# Patient Record
Sex: Female | Born: 2005 | Hispanic: Yes | Marital: Single | Smoking: Never smoker
Health system: Southern US, Community
[De-identification: ages and names within clinical notes are randomized; demographics above are authoritative.]

---
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
## 2023-02-10 ENCOUNTER — Telehealth: Payer: Self-pay

## 2023-02-10 ENCOUNTER — Other Ambulatory Visit: Payer: Self-pay | Admitting: Obstetrics and Gynecology

## 2023-02-10 DIAGNOSIS — E0590 Thyrotoxicosis, unspecified without thyrotoxic crisis or storm: Secondary | ICD-10-CM

## 2023-03-10 ENCOUNTER — Other Ambulatory Visit: Payer: Self-pay

## 2023-03-10 ENCOUNTER — Ambulatory Visit: Payer: Medicaid Other | Attending: Maternal & Fetal Medicine

## 2023-03-10 ENCOUNTER — Ambulatory Visit: Payer: Medicaid Other | Admitting: Maternal & Fetal Medicine

## 2023-03-10 DIAGNOSIS — Z3A27 27 weeks gestation of pregnancy: Secondary | ICD-10-CM | POA: Insufficient documentation

## 2023-03-10 DIAGNOSIS — O99280 Endocrine, nutritional and metabolic diseases complicating pregnancy, unspecified trimester: Secondary | ICD-10-CM | POA: Diagnosis present

## 2023-03-10 DIAGNOSIS — Z363 Encounter for antenatal screening for malformations: Secondary | ICD-10-CM | POA: Diagnosis not present

## 2023-03-10 DIAGNOSIS — O99283 Endocrine, nutritional and metabolic diseases complicating pregnancy, third trimester: Secondary | ICD-10-CM | POA: Diagnosis not present

## 2023-03-10 DIAGNOSIS — E0590 Thyrotoxicosis, unspecified without thyrotoxic crisis or storm: Secondary | ICD-10-CM | POA: Diagnosis present

## 2023-03-10 DIAGNOSIS — O35BXX0 Maternal care for other (suspected) fetal abnormality and damage, fetal cardiac anomalies, not applicable or unspecified: Secondary | ICD-10-CM

## 2023-03-10 DIAGNOSIS — O99282 Endocrine, nutritional and metabolic diseases complicating pregnancy, second trimester: Secondary | ICD-10-CM | POA: Insufficient documentation

## 2023-03-10 DIAGNOSIS — O358XX0 Maternal care for other (suspected) fetal abnormality and damage, not applicable or unspecified: Secondary | ICD-10-CM | POA: Insufficient documentation

## 2023-03-10 NOTE — Progress Notes (Signed)
MFM Consultation  Donna Barajas is a 17 yo G1P0 who is seen at 69 w 4 d with and EDD of 06/05/23. She is seen at the request of Dr. Thomasene Mohair regarding the diagnosis of hyperthyroidism.  Today she is doing well without s/sx of PTL or preeclampsia. In addition, she denies s/sx of hyperthyroidism such as tachycardia, weight loss, or hair loss. She reports never experiencing these symptoms.  Per her records she had an early  (11/12/22) low TSH of  0.17 and FT4 of 1.40 both slightly off of cut off.  On 5/23 she had FT4 0.62  and a TSH of .706 On 6/24 she had a TSH of 1.295 and FT4 0.65.   She was started on Methimazole 10 mg on 3/29. Donna Barajas reports taking it more consistently now but was without it for a week. She reports that she feels no different at this time.   Thus her was in her first trimester when the medication was began.  Lastly, Donna Barajas reports that she will be leaving for [place] on 8/25 where she plans to deliver the baby as the father of the baby lives there.      03/10/2023   12:56 PM 12/08/2022    8:04 PM 12/08/2022    5:13 PM  Vitals with BMI  Height 5\' 0"     Weight 125 lbs    BMI 24.41    Systolic 105 118 401  Diastolic 64 74 81  Pulse 92 88 113      Latest Ref Rng & Units 12/01/2022    2:58 AM 05/15/2019    3:22 PM 10/10/2018   12:41 PM  CBC  WBC 4.0 - 10.5 K/uL 10.0  7.7  10.5   Hemoglobin 12.0 - 15.0 g/dL 02.7  25.3  66.4   Hematocrit 36.0 - 46.0 % 36.2  39.2  41.6   Platelets 150 - 400 K/uL 255  311  310       Latest Ref Rng & Units 12/01/2022    2:58 AM 05/15/2019    3:22 PM 10/10/2018   12:41 PM  CMP  Glucose 70 - 99 mg/dL 96  91  403   BUN 6 - 20 mg/dL 10  14  8    Creatinine 0.44 - 1.00 mg/dL 4.74  2.59  5.63   Sodium 135 - 145 mmol/L 134  139  137   Potassium 3.5 - 5.1 mmol/L 3.7  3.6  3.4   Chloride 98 - 111 mmol/L 106  103  104   CO2 22 - 32 mmol/L 20  22  22    Calcium 8.9 - 10.3 mg/dL 9.0  9.6  9.6   Total Protein 6.5 - 8.1 g/dL   8.1    Total Bilirubin 0.3 - 1.2 mg/dL   0.6   Alkaline Phos 50 - 162 U/L   102   AST 15 - 41 U/L   25   ALT 0 - 44 U/L   15     No past medical history on file. No past surgical history on file. Family History  Problem Relation Age of Onset   Hypertension Mother    Hyperlipidemia Mother    Diabetes Mother    Depression Mother    Anemia Sister    Current Outpatient Medications (Endocrine & Metabolic):    methimazole (TAPAZOLE) 10 MG tablet, Take 10 mg by mouth daily.      Current Outpatient Medications (Other):    Prenatal Vit-Fe Fumarate-FA (PRENATAL MULTIVITAMIN)  TABS tablet, Take 1 tablet by mouth daily at 12 noon. No Known Allergies Social History   Socioeconomic History   Marital status: Single    Spouse name: Not on file   Number of children: Not on file   Years of education: Not on file   Highest education level: Associate degree: academic program  Occupational History   Occupation: Sales    Comment: Corporate investment banker  Tobacco Use   Smoking status: Never   Smokeless tobacco: Never  Vaping Use   Vaping status: Never Used  Substance and Sexual Activity   Alcohol use: No   Drug use: No   Sexual activity: Never  Other Topics Concern   Not on file  Social History Narrative   Not on file   Social Determinants of Health   Financial Resource Strain: Not on file  Food Insecurity: Not on file  Transportation Needs: Not on file  Physical Activity: Not on file  Stress: Not on file  Social Connections: Not on file  Intimate Partner Violence: Not on file   Imaging:  Single intrauterine pregnancy with measurements consistent with dates.  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 Normal otherwise anatomy. No markers of aneuploidy observed. Good fetal movement and amniotic fluid  volume.  Impression/Counseling:  Hyperthyroidism vs TSH stimulation of the hcg molecule.  I discussed with Donna Barajas her labs and the diagnosis of hyperthyroidism in pregnancy.   She had an early low TSH and FT4 and was started on Methimazole. Reference ranges for the first trimester is 0.1 - 2.5 *. Her was 0.017 with a FT4 of 1.40. But slight outside of normal. Since starting on her Methimazole her s/sx have been unchanged but her TFT's appear normal.  I discussed that given that her TSH was drawn during the first trimester could have repeated those labs in 4 weeks given that she was asymptomatic, however, given that she is on methimazole I recommend that she wean her dose down to half today and recheck her labs in 3-4 weeks.   We discussed the risk of hyperthyroidism to include. The prevalence of hyperthyroidism in pregnant women ranges from 0.05% to 0.2%. The signs and symptoms of mild to moderate hyperthyroidism--heat intolerance, diaphoresis, fatigue, anxiety, emotional lability, tachycardia, and a wide pulse pressure--can be mimicked by the hypermetabolic state of normal pregnancy. However, weight loss, tachycardia greater than 100?beats/min, and diffuse goiter are features that may suggest hyperthyroidism. Thyrotoxicosis usually presents in the late first or early second trimester. Thyroid storm occurs in 1-2% of pregnancies complicated by hyperthyroidism. It manifests with fever, diaphoresis, cardiac abnormalities (tachycardia, arrhythmia, heart failure), CNS abnormalities (irritability, agitation, tremor, mental status change), GI (nausea, vomiting, diarrhea), and laboratory changes (leukocytosis, elevated LFT, elevated free T3/T4). Prompt Maternal Fetal Medicine and Endocrinology consultation should be obtained.  Complications associated with poorly controlled hyperthyroidism include hypertensive disorders in pregnancy, thyroid storm, heart failure, preterm labor, placental abruption, growth  restriction, and neonatal death. Specifically, the two most serious maternal complications of untreated hyperthyroidism are heart failure and thyroid storm, a medical emergency associated with a maternal mortality rate of 25% even with appropriate management. Heart failure is the more common of the two serious maternal complications. It is caused by the long-term myocardial effects of T4 and is intensified by other pregnancy conditions such as preeclampsia, infection, or anemia. There is  a risk of fetal and/or neonatal thyrotoxicosis due to the passive transplacental passage of immunoglobulins associated with Graves' disease. Such antibodies are able to exert an effect on the fetal thyroid at 20 weeks' gestation. Infants born to mothers with high titers of antibodies or poorly controlled [id number]  are particularly at risk. Fetal thyrotoxicosis may result in preterm delivery and the development of fetal craniosynostosis, exophthalmos, heart failure (hydrops fetalis), hepatosplenomegaly, thrombocytopenia, exophthalmos, goiter (with neck obstruction and polyhydramnios), and growth restriction. Additional neonatal features include jaundice, poor feeding, poor weight gain, and irritability. The mortality of the condition may be as high as 25%.   The goal of therapy is to control the hyperthyroidism without causing fetal or neonatal hypothyroidism. Maternal free T4 should be maintained in the high-normal range or total T4 at 1.5 times the upper limit of normal (nonpregnant) range.Propylthiouracil (PTU) has been considered the drug of choice in young women who may become pregnant and in pregnant patients with hyperthyroidism. This preference was based on reports of methimazole embryopathy that was not seen with PTU, although a later report linked PTU with cutis aplasia, a scalp deformity. Reports describing higher hepatotoxicity profiles for PTU compared with methimazole, especially in children and adolescents, have  raised concern regarding the use of PTU in the management of hyperthyroidism even in pregnant patients. These concerns had to be balanced by the known embryopathy associated with methimazole. The transplacental passage of the two drugs is similar. Methimazole may cause cutis aplasia, a scalp deformity. Although rare, there are reports of methimazole and carbimazole embryopathy with choanal atresia, tracheoesophageal fistula, and facial anomalies..T4 levels should initially be measured at 2-4 week intervals. PTU is given every 8 hours at doses of 100 to 150?mg (300 to 450?mg total daily dosage) according to thyrotoxicosis severity. Equivalent doses of PTU relative to methimazole are 10?:?1 or 15?:?1 (100?mg of PTU is equivalent to 7.5 to 10?mg of methimazole). Usual starting doses are up to 300?mg of PTU and up to 20?mg of methimazole. The occasional patient may require higher doses because the risk of uncontrolled hyperthyroidism is greater than that of high-dose PTU or methimazole. It can take 6 to 8 weeks for major clinical effects to manifest Lastly, a subtotal thyroidectomy can be performed after thyroid toxicosis is medically controlled, but is seldom done during pregnancy. Ablation with therapeutic radioactive iodine is contraindicated during pregnancy.  After the patient is euthyroid (reflected by monthly free T4 and free T3 values), the dose of antithyroid drugs should be tapered (e.g., halved), with further reduction as the pregnancy progresses. For many patients, antithyroid drugs can be discontinued by 32 to 36 weeks' gestation, because remission of Graves disease during pregnancy is commonly observed, although relapse often occurs after delivery. Maternal side effects of PTU or methimazole treatment can include rash (?5%), pruritus, drug-related fever, hepatitis, a lupus-like syndrome, and bronchospasm. The alternative thionamide can be used, although cross-sensitivity occurs in 50% of patients.  Agranulocytosis, which is the most serious side effect, develops in only 0.1%, occurring especially in older women and those receiving higher doses. All patients experiencing fever or unexpected sore throat on therapy should discontinue the drug and have white blood cell count monitoring. Agranulocytosis is a contraindication to further thionamide therapy; the blood count gradually improves over days or weeks.  PTU is not significantly concentrated in breast milk (10% of serum) and does not appear to affect the infant's thyroid hormone levels in any major way. Methimazole also does not appear to affect subsequent somatic or  intellectual growth in children exposed to it during lactation. Antithyroid medication should be taken just after breastfeeding, allowing a 3- to 4-hour interval before the woman lactates again. In patients who are in remission, the postpartum period of a subsequent pregnancy is significantly associated with relapse of Graves disease compared with those without a subsequent pregnancy.   In cases of thyroid storm, the following recommendations are encouraged:  initial treatment consists of laboratory evaluation, cooling blankets, telemetry and O2 therapy. Initial medical treatment is with PTU PO or by NG tube (300-600mg  then 150-300mg  q6hr). 1-2 hours after PTU is given, iodine should be given to inhibit T4 release. It is essential to delay iodine administration until after PTU is given. Options include oral potassium iodide (SSKI) 2-5 drops PO q8 hours, Lugol solution 8 drops q 6 hours, or IV sodium iodide 500-1000mg  every 8 hours. If these are unavailable oral gastrografin (Oragrafin, 62% iodide) 3g PO x 1 can be given and will suppress T4 release for 2-3 days. Glucocordicoids should also be initiated to prevent release and peripheral conversion of stored T4. Options include dexamethasone (2mg  IV/IM q 6 hours x 4 doses), hydrocortisone (300mg  IV daily) or prednisone 60mg  PO daily). Beta  blockers and phenobarbital can be used as needed to control tachycardia and agitation. Further management and timing and route of delivery is individualized and best accomplished in a multidisciplinary fashion.  Given that she is euthyroid at this time I recommend halving her dose. I recommend an Endocrinology appointment for testing in the postpartum period.   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  The septum pellucidum (SP) is a single, vertically orientated structure, positioned between the medial border of the frontal horns and the bodies of the lateral ventricles. It is intimately related to the corpus callosum, which provides its rostral and superior boundaries. The body of the fornix forms its posterior-inferior part. The septum pellucidum does not contain organized gray matter and is not simply a membrane. It is made of two sheaths of white matter opposed to each other along the midline and has been shown to carry fibers. In the fetus, a space is found between the pellucidal leaves, the well-known cavum septi pellucidi (CSP).  Septal agenesis is a rare brain abnormality that can be isolated or be part of cortical abnormalities, ACC, holoprosencephaly, or septo-optic dysplasia.  These anomalies can be subtle, leading to difficulties in prenatal diagnosis and management.  Septum Pellucidum is partly or completely absent in 2:100,000 to 3:100,000 in the general population. We reviewed that there are no prenatal treatment is available.  Agenesis of the corpus callosum varies in incidence depending on the studied population.  Estimates of 0.3-0.7% in the general population and in 2-3% of developmentally disabled.  The etiology is heterogeneous and includes: genetic, syndromic, infectious and sporadic associations.  Agenesis of the corpus callosum (ACC), can be complete or partial.  Sonographic diagnosis is difficult because it is a narrow band of fibers not often  assessed in standard axial imaging.  Secondary sonographic characteristics include failure to visualize the cavum septum pellucidum, midline lipoma, interhemispheric cysts, ventriculomegaly, colpochephaly, a higher riding third ventricle and/or a widened interhemispheric fissure.  The definitive diagnosis is made direct visualization of the structure with ultrasound or fetal MRI after 20 weeks.  There is a high frequency of associated anomalies. ACC is non lethal.   Isolated agenesis of the corpus callosum may be either a completely asymptomatic event or revealed during the course  of a neurologic examination by subtle deficits, such as inability to match stimuli using both hands or to discriminate differences in temperature, shape, and weight in objects placed in both hands.  Persons with agenesis of the corpus callosum may have neurologic problems, such as seizures, intellectual impairment, and psychosis. However, these conditions are most likely caused by abnormalities associated cerebral anomalies rather than in the corpus callosum per se. In postnatal series, children with isolated agenesis of the corpus callosum are more frequently free from neurologic compromise. The worst outcomes are found in the presence of migrational disorder with or without Dandy-Walker malformation.  Counseling couples with fetuses that have isolated non-familial agenesis of the corpus callosum is a difficult task. Pediatric series are based upon investigation of symptomatic individuals and are therefore presumably biased. The experience with antenatal diagnosis thus far is limited, but seems more favorable than expected from postnatal data. A total of 37 infants with a prenatal diagnosis of isolated agenesis of the corpus callosum (no other malformations demonstrable at sonography and a normal karyotype) have been reported thus far. The duration of postnatal follow-up studies ranges between a few months to 11 years. A normal or  borderline development was present in 32, or 86%.  In all cases with severe handicap other anomalies were present.  Isolated agenesis of the corpus callosum does not require any modification of standard obstetric management.    Definitive diagnosis will require a pre or postnatal MRI and pediatric neurology consultation will be acquired after deliver.  Donna Barajas has a low risk NIPT and declined diagnostic amniocentesis.   At the conclusion of my counseling Donna Barajas will return in 4 weeks to repeat growth. She will half her dose of methimazole Will discuss next steps with Dr. Dalbert Garnet who I reviewed this plan with. She will call me with the repeat TFT's.  I spent 60 minutes with > 50% in face to face consultation, medical record review and care coordination.  All questions answered.  Novella Olive, MD.

## 2023-03-31 ENCOUNTER — Other Ambulatory Visit: Payer: Self-pay

## 2023-03-31 DIAGNOSIS — E0590 Thyrotoxicosis, unspecified without thyrotoxic crisis or storm: Secondary | ICD-10-CM

## 2023-04-05 ENCOUNTER — Other Ambulatory Visit: Payer: Self-pay

## 2023-04-05 ENCOUNTER — Ambulatory Visit: Payer: Medicaid Other | Attending: Obstetrics

## 2023-04-05 DIAGNOSIS — Z3A31 31 weeks gestation of pregnancy: Secondary | ICD-10-CM | POA: Diagnosis not present

## 2023-04-05 DIAGNOSIS — E0590 Thyrotoxicosis, unspecified without thyrotoxic crisis or storm: Secondary | ICD-10-CM | POA: Diagnosis present

## 2023-04-05 DIAGNOSIS — O99283 Endocrine, nutritional and metabolic diseases complicating pregnancy, third trimester: Secondary | ICD-10-CM | POA: Diagnosis not present

## 2023-04-05 DIAGNOSIS — O359XX0 Maternal care for (suspected) fetal abnormality and damage, unspecified, not applicable or unspecified: Secondary | ICD-10-CM | POA: Diagnosis not present

## 2023-04-05 DIAGNOSIS — O35BXX0 Maternal care for other (suspected) fetal abnormality and damage, fetal cardiac anomalies, not applicable or unspecified: Secondary | ICD-10-CM | POA: Diagnosis not present

## 2023-04-05 DIAGNOSIS — O3500X0 Maternal care for (suspected) central nervous system malformation or damage in fetus, unspecified, not applicable or unspecified: Secondary | ICD-10-CM | POA: Diagnosis not present
